# Patient Record
Sex: Male | Born: 1980 | Race: White | Hispanic: No | Marital: Married | Smoking: Never smoker
Health system: Southern US, Community
[De-identification: ages and names within clinical notes are randomized; demographics above are authoritative.]

---

## 2016-08-15 ENCOUNTER — Other Ambulatory Visit: Payer: Self-pay | Admitting: Family Medicine

## 2016-08-15 ENCOUNTER — Ambulatory Visit
Admission: RE | Admit: 2016-08-15 | Discharge: 2016-08-15 | Disposition: A | Discharge status: Home / Self Care | Payer: 59 | Source: Ambulatory Visit | Attending: Family Medicine | Admitting: Family Medicine

## 2016-08-15 DIAGNOSIS — R0781 Pleurodynia: Secondary | ICD-10-CM

## 2017-04-05 ENCOUNTER — Encounter: Payer: Self-pay | Admitting: Physician Assistant

## 2017-04-05 ENCOUNTER — Other Ambulatory Visit: Payer: Self-pay

## 2017-04-05 ENCOUNTER — Ambulatory Visit: Payer: 59 | Admitting: Physician Assistant

## 2017-04-05 VITALS — BP 126/80 | HR 84 | Temp 98.1°F | Resp 18 | Ht 71.0 in | Wt 188.6 lb

## 2017-04-05 DIAGNOSIS — J069 Acute upper respiratory infection, unspecified: Secondary | ICD-10-CM

## 2017-04-05 MED ORDER — NAPROXEN 500 MG PO TABS: 500.0000 mg | ORAL_TABLET | Freq: Two times a day (BID) | ORAL | 0 refills | Status: AC

## 2017-04-05 MED ORDER — FLUTICASONE PROPIONATE 50 MCG/ACT NA SUSP: 2.0000 | Freq: Every day | NASAL | 6 refills | Status: DC

## 2017-04-05 MED ORDER — CETIRIZINE-PSEUDOEPHEDRINE ER 5-120 MG PO TB12: 1.0000 | ORAL_TABLET | Freq: Two times a day (BID) | ORAL | 0 refills | Status: AC

## 2017-04-05 NOTE — Progress Notes (Signed)
    04/05/2017 4:20 PM   DOB: 1980-04-20 / MRN: [id number]  SUBJECTIVE:  Omar Carlson is a [age] y.o. male presenting for nasal congestion, sneezing, sore throat all of which started yesterday.  He would like a note for work.  Denies fever.  He has No Known Allergies.   He  has no past medical history on file.    He  reports that he has never smoked. He has never used smokeless tobacco. He  has no sexual activity history on file. The patient  has no past surgical history on file.  His family history is not on file.  Review of Systems  Constitutional: Negative for chills, diaphoresis and fever.  Eyes: Negative.   Respiratory: Negative for shortness of breath.   Cardiovascular: Negative for chest pain, orthopnea and leg swelling.  Gastrointestinal: Negative for abdominal pain, blood in stool, constipation, diarrhea, heartburn, melena, nausea and vomiting.  Genitourinary: Negative for flank pain.  Skin: Negative for rash.  Neurological: Negative for dizziness, sensory change, speech change, focal weakness and headaches.    The problem list and medications were reviewed and updated by myself where necessary and exist elsewhere in the encounter.   OBJECTIVE:  BP 126/80   Pulse 84   Temp 98.1 F (36.7 C) (Oral)   Resp 18   Ht 5\' 11"  (1.803 m)   Wt 188 lb 9.6 oz (85.5 kg)   SpO2 99%   BMI 26.30 kg/m   Physical Exam  Constitutional: He is oriented to person, place, and time. He is active and cooperative.  Eyes: Pupils are equal, round, and reactive to light. EOM are normal.  Cardiovascular: Normal rate, regular rhythm, S1 normal, S2 normal, normal heart sounds, intact distal pulses and normal pulses. Exam reveals no gallop and no friction rub.  No murmur heard. Pulmonary/Chest: Effort normal. No stridor. No tachypnea. No respiratory distress. He has no wheezes. He has no rales.  Abdominal: He exhibits no distension.  Musculoskeletal: He exhibits no edema.  Neurological: He is alert  and oriented to person, place, and time. He has normal strength and normal reflexes. He is not disoriented. No cranial nerve deficit or sensory deficit. He exhibits normal muscle tone. Coordination and gait normal.  Skin: Skin is warm.  Psychiatric: His behavior is normal.  Vitals reviewed.   No results found for this or any previous visit (from the past 72 hour(s)).  No results found.  ASSESSMENT AND PLAN:  [name] was seen today for nasal congestion.  Diagnoses and all orders for this visit:  Acute URI -     naproxen (NAPROSYN) 500 MG tablet; Take 1 tablet (500 mg total) by mouth 2 (two) times daily with a meal. -     cetirizine-pseudoephedrine (ZYRTEC-D) 5-120 MG tablet; Take 1 tablet by mouth 2 (two) times daily for 7 days. -     fluticasone (FLONASE) 50 MCG/ACT nasal spray; Place 2 sprays into both nostrils daily.    The patient is advised to call or return to clinic if he does not see an improvement in symptoms, or to seek the care of the closest emergency department if he worsens with the above plan.   Deliah BostonMichael Clark, MHS, PA-C [facility] 04/05/2017 4:20 PM

## 2017-04-05 NOTE — Patient Instructions (Signed)
     IF you received an x-ray today, you will receive an invoice from [facility]. Please contact [facility] at [phone] with questions or concerns regarding your invoice.   IF you received labwork today, you will receive an invoice from [facility]. Please contact [facility] at [phone] with questions or concerns regarding your invoice.   Our billing staff will not be able to assist you with questions regarding bills from these companies.  You will be contacted with the lab results as soon as they are available. The fastest way to get your results is to activate your My Chart account. Instructions are located on the last page of this paperwork. If you have not heard from us regarding the results in 2 weeks, please contact this office.     

## 2018-02-26 ENCOUNTER — Ambulatory Visit: Payer: Self-pay | Admitting: Family Medicine

## 2018-08-28 IMAGING — CR DG CHEST 2V
2 series · 2 of 2 positions shown · non-contrast
Comparison: None.

CLINICAL DATA: Fall 2 weeks ago with left-sided chest pain, initial
encounter

EXAM:
CHEST  2 VIEW

[w chest pa]
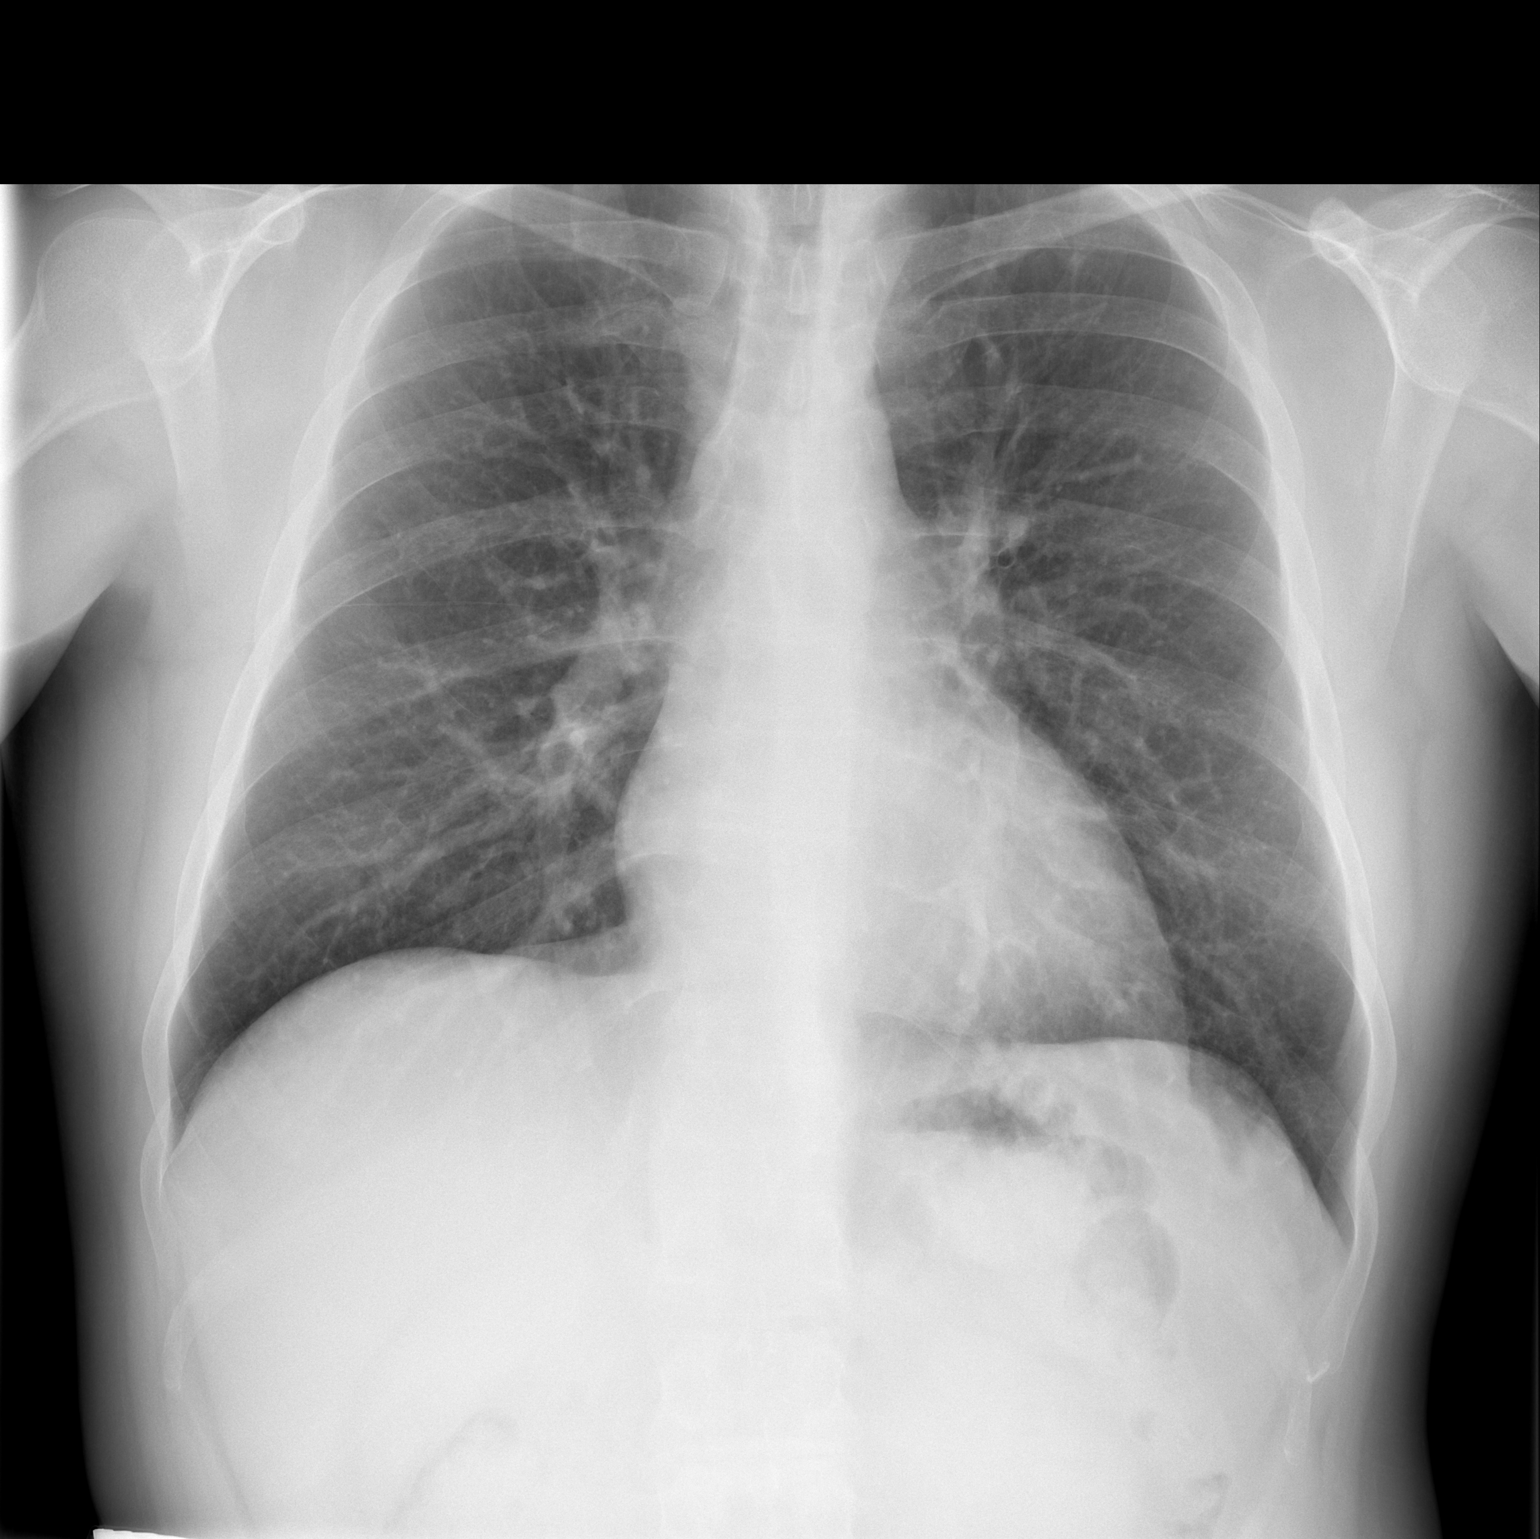

[w chest lat]
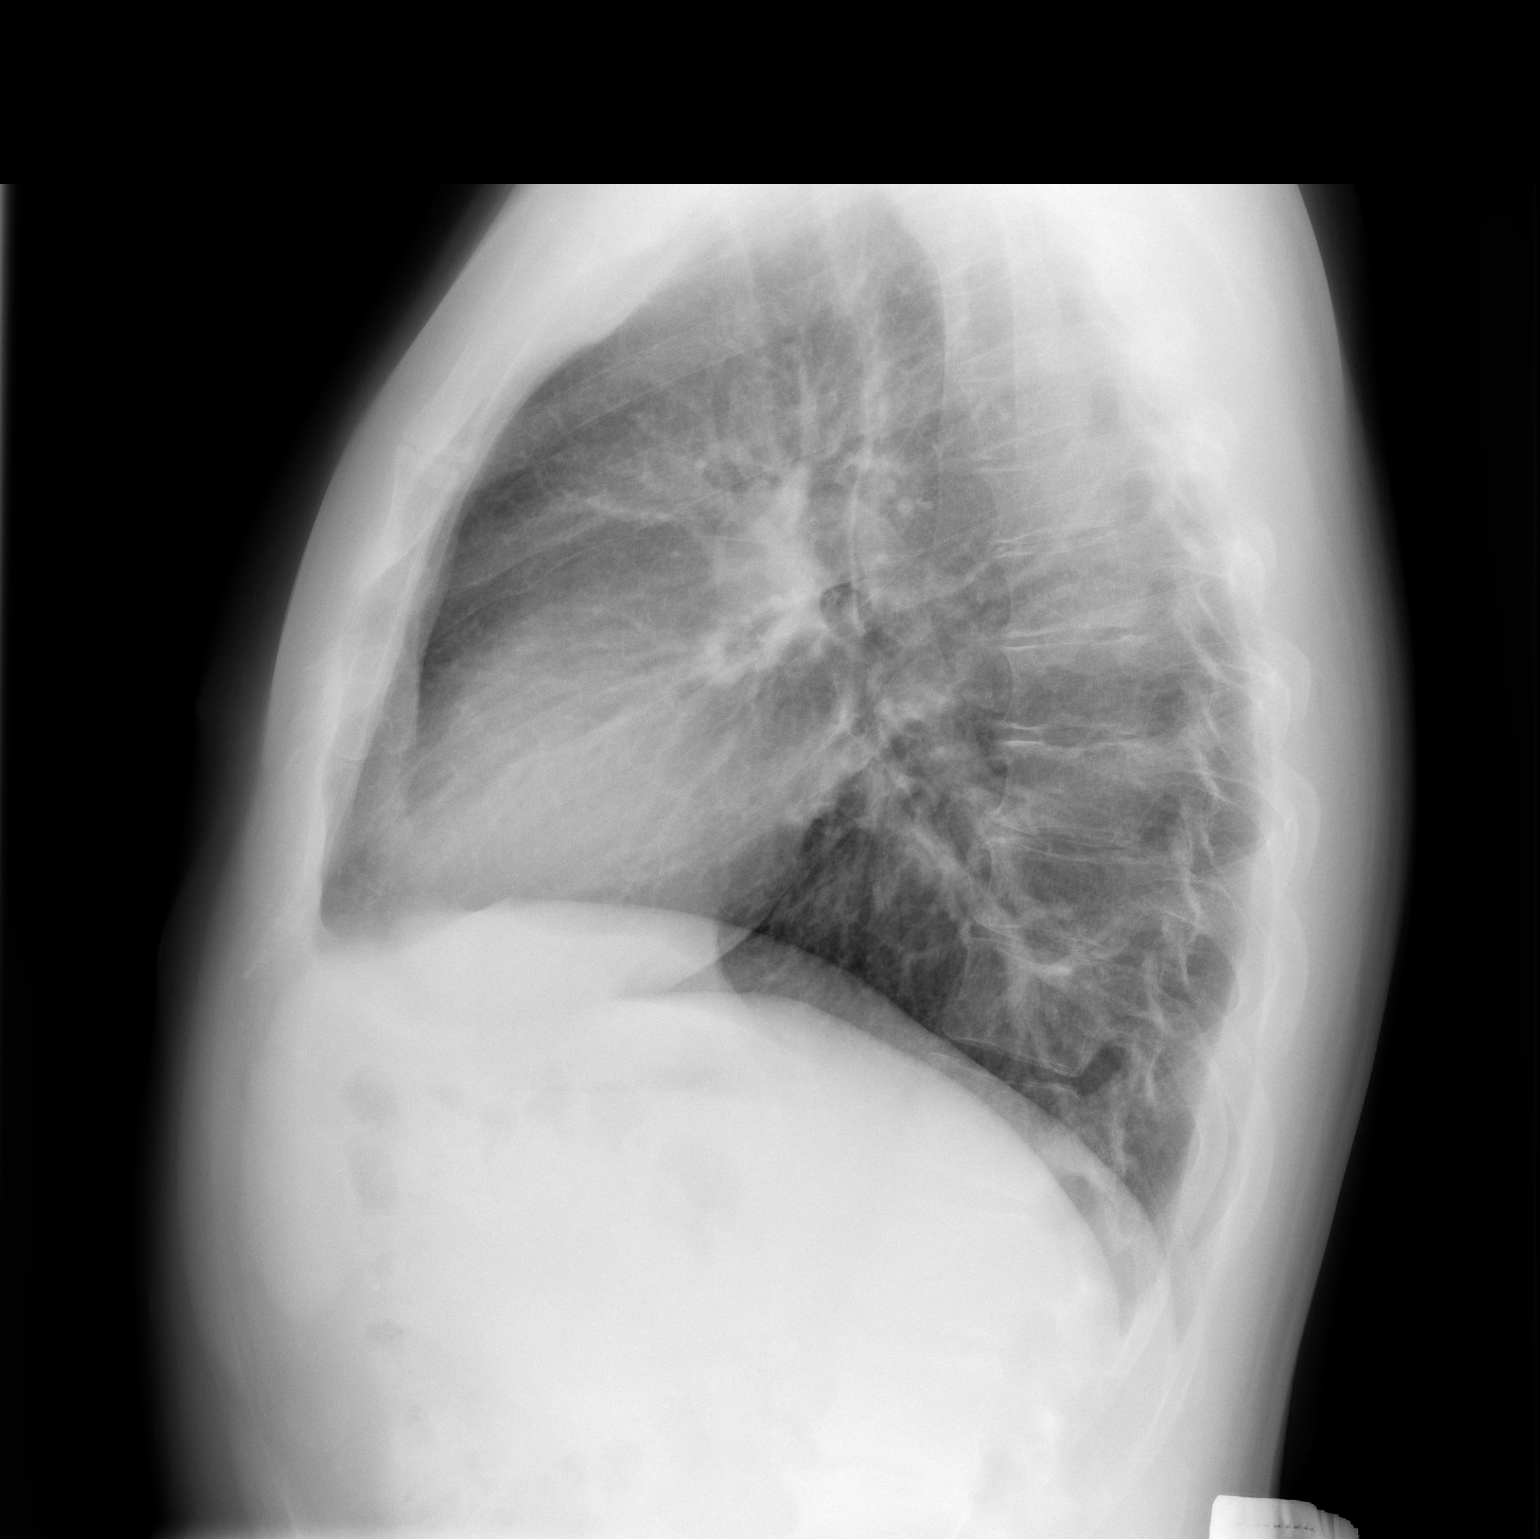

[2 of 2 positions shown; findings below may reference images not displayed]

FINDINGS: The heart size and mediastinal contours are within normal limits.
Both lungs are clear. The visualized skeletal structures are
unremarkable.
IMPRESSION: No active cardiopulmonary disease.

## 2019-02-19 ENCOUNTER — Other Ambulatory Visit: Payer: Self-pay

## 2019-02-19 ENCOUNTER — Emergency Department (HOSPITAL_COMMUNITY)
Admission: EM | Admit: 2019-02-19 | Discharge: 2019-02-19 | Disposition: A | Discharge status: Home / Self Care | Payer: 59 | Attending: Emergency Medicine | Admitting: Emergency Medicine

## 2019-02-19 ENCOUNTER — Encounter (HOSPITAL_COMMUNITY): Payer: Self-pay | Admitting: Emergency Medicine

## 2019-02-19 ENCOUNTER — Emergency Department (HOSPITAL_COMMUNITY): Payer: 59

## 2019-02-19 DIAGNOSIS — Z20822 Contact with and (suspected) exposure to covid-19: Secondary | ICD-10-CM | POA: Insufficient documentation

## 2019-02-19 DIAGNOSIS — J069 Acute upper respiratory infection, unspecified: Secondary | ICD-10-CM | POA: Insufficient documentation

## 2019-02-19 DIAGNOSIS — R05 Cough: Secondary | ICD-10-CM | POA: Diagnosis present

## 2019-02-19 DIAGNOSIS — M7918 Myalgia, other site: Secondary | ICD-10-CM | POA: Diagnosis not present

## 2019-02-19 DIAGNOSIS — R110 Nausea: Secondary | ICD-10-CM | POA: Insufficient documentation

## 2019-02-19 DIAGNOSIS — R059 Cough, unspecified: Secondary | ICD-10-CM

## 2019-02-19 LAB — CBC WITH DIFFERENTIAL/PLATELET
Abs Immature Granulocytes: 0.08 10*3/uL — ABNORMAL HIGH (ref 0.00–0.07)
Basophils Absolute: 0 10*3/uL (ref 0.0–0.1)
Basophils Relative: 0 %
Eosinophils Absolute: 0 10*3/uL (ref 0.0–0.5)
Eosinophils Relative: 0 %
HCT: 48.7 % (ref 39.0–52.0)
Hemoglobin: 16.6 g/dL (ref 13.0–17.0)
Immature Granulocytes: 1 %
Lymphocytes Relative: 5 %
Lymphs Abs: 0.8 10*3/uL (ref 0.7–4.0)
MCH: 30.2 pg (ref 26.0–34.0)
MCHC: 34.1 g/dL (ref 30.0–36.0)
MCV: 88.5 fL (ref 80.0–100.0)
Monocytes Absolute: 0.6 10*3/uL (ref 0.1–1.0)
Monocytes Relative: 4 %
Neutro Abs: 14.5 10*3/uL — ABNORMAL HIGH (ref 1.7–7.7)
Neutrophils Relative %: 90 %
Platelets: 323 10*3/uL (ref 150–400)
RBC: 5.5 MIL/uL (ref 4.22–5.81)
RDW: 12.6 % (ref 11.5–15.5)
WBC: 16.1 10*3/uL — ABNORMAL HIGH (ref 4.0–10.5)
nRBC: 0 % (ref 0.0–0.2)

## 2019-02-19 LAB — RESPIRATORY PANEL BY RT PCR (FLU A&B, COVID)
Influenza A by PCR: NEGATIVE
Influenza B by PCR: NEGATIVE
SARS Coronavirus 2 by RT PCR: NEGATIVE

## 2019-02-19 LAB — BASIC METABOLIC PANEL
Anion gap: 15 (ref 5–15)
BUN: 7 mg/dL (ref 6–20)
CO2: 25 mmol/L (ref 22–32)
Calcium: 9.8 mg/dL (ref 8.9–10.3)
Chloride: 95 mmol/L — ABNORMAL LOW (ref 98–111)
Creatinine, Ser: 0.8 mg/dL (ref 0.61–1.24)
GFR calc Af Amer: >60 mL/min (ref >60)
GFR calc non Af Amer: >60 mL/min (ref >60)
Glucose, Bld: 127 mg/dL — ABNORMAL HIGH (ref 70–99)
Potassium: 3.6 mmol/L (ref 3.5–5.1)
Sodium: 135 mmol/L (ref 135–145)

## 2019-02-19 LAB — D-DIMER, QUANTITATIVE: D-Dimer, Quant: <0.27 ug/mL-FEU (ref 0.00–0.50)

## 2019-02-19 MED ORDER — ONDANSETRON HCL 4 MG/2ML IJ SOLN
4.0000 mg | Freq: Once | INTRAMUSCULAR | Status: AC
  Administered 2019-02-19: 4 mg via INTRAVENOUS
  Filled 2019-02-19: qty 2

## 2019-02-19 MED ORDER — PROMETHAZINE HCL 25 MG/ML IJ SOLN
25.0000 mg | Freq: Once | INTRAMUSCULAR | Status: AC
  Administered 2019-02-19: 25 mg via INTRAVENOUS
  Filled 2019-02-19: qty 1

## 2019-02-19 MED ORDER — ACETAMINOPHEN-CODEINE 120-12 MG/5ML PO SOLN: 10.0000 mL | ORAL | 0 refills | Status: AC | PRN

## 2019-02-19 MED ORDER — PROMETHAZINE HCL 25 MG PO TABS: 25.0000 mg | ORAL_TABLET | Freq: Three times a day (TID) | ORAL | 0 refills | Status: DC | PRN

## 2019-02-19 MED ORDER — GUAIFENESIN ER 1200 MG PO TB12: 1.0000 | ORAL_TABLET | Freq: Two times a day (BID) | ORAL | 0 refills | Status: DC

## 2019-02-19 MED ORDER — ACETAMINOPHEN 500 MG PO TABS
1000.0000 mg | ORAL_TABLET | Freq: Once | ORAL | Status: AC
  Administered 2019-02-19: 1000 mg via ORAL
  Filled 2019-02-19: qty 2

## 2019-02-19 MED ORDER — SODIUM CHLORIDE 0.9 % IV BOLUS
1000.0000 mL | Freq: Once | INTRAVENOUS | Status: AC
  Administered 2019-02-19: 10:00:00 1000 mL via INTRAVENOUS

## 2019-02-19 NOTE — Discharge Instructions (Addendum)
Return here as needed. You still can have Covid but it could be early on and not detected. You need to quarantine for the next 10-14 days. You need to return for any worsening in your condition.

## 2019-02-19 NOTE — ED Triage Notes (Signed)
Pt arrives POV with complaints of fever 100.8 at home, body aches, chills, vomiting X3. Pt took home meds with no relief.  Pt endorses drinking 6-8 beers daily after work but says this does not feel like withdrawal.  Last drink Sunday at noon.

## 2019-02-21 ENCOUNTER — Other Ambulatory Visit: Payer: Self-pay

## 2019-02-21 ENCOUNTER — Ambulatory Visit
Admission: EM | Admit: 2019-02-21 | Discharge: 2019-02-21 | Disposition: A | Discharge status: Home / Self Care | Payer: 59 | Attending: Family Medicine | Admitting: Family Medicine

## 2019-02-21 ENCOUNTER — Encounter: Payer: Self-pay | Admitting: Family Medicine

## 2019-02-21 DIAGNOSIS — U071 COVID-19: Secondary | ICD-10-CM | POA: Diagnosis not present

## 2019-02-21 MED ORDER — LORAZEPAM 1 MG PO TABS: 1.0000 mg | ORAL_TABLET | Freq: Every evening | ORAL | 0 refills | Status: AC | PRN

## 2019-02-21 MED ORDER — PROMETHAZINE HCL 25 MG PO TABS: 25.0000 mg | ORAL_TABLET | Freq: Three times a day (TID) | ORAL | 0 refills | Status: AC | PRN

## 2019-02-21 MED ORDER — ONDANSETRON 8 MG PO TBDP: 8.0000 mg | ORAL_TABLET | Freq: Three times a day (TID) | ORAL | 0 refills | Status: AC | PRN

## 2019-02-21 NOTE — ED Triage Notes (Signed)
Pt c/o fever, cough, headache, and nausea since Sunday. States seen and tx in ED on Tuesday. Pt states not feeling any better. States had 2 neg. COVID.

## 2019-02-21 NOTE — ED Provider Notes (Addendum)
[facility]    CSN: [id number] Arrival date & time: 02/21/19  1249      History   Chief Complaint Chief Complaint  Patient presents with  . Fever    HPI Tayven Renteria is a 39 y.o. male.   Initial [facility] visit  39 yo man with shortness of breath, seen two days ago in ED with normal portable CXR, Covid test negative.  His WBC was 16,000 and d-dimer was negative.  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 States seen and tx in ED on Tuesday. Pt states not feeling any better. States had 2 neg. COVID.  History reviewed. No pertinent past medical history.  There are no problems to display for this patient.   History reviewed. No pertinent surgical history.     Home Medications    Prior to Admission medications   Medication Sig Start Date End Date Taking? Authorizing Provider  acetaminophen-codeine 120-12 MG/5ML solution Take 10 mLs by mouth every 4 (four) hours as needed for moderate pain. 02/19/19   Lawyer, Cristal Deer, PA-C  LORazepam (ATIVAN) 1 MG tablet Take 1 tablet (1 mg total) by mouth at bedtime as needed and may repeat dose one time if needed for anxiety. 02/21/19   Elvina Sidle, MD  naproxen (NAPROSYN) 500 MG tablet Take 1 tablet (500 mg total) by mouth 2 (two) times daily with a meal. 04/05/17   Ofilia Neas, PA-C  promethazine (PHENERGAN) 25 MG tablet Take 1 tablet (25 mg total) by mouth every 8 (eight) hours as needed for nausea or vomiting. 02/21/19   Elvina Sidle, MD    Family History History reviewed. No pertinent family history.  Social History Social History   Tobacco Use  . Smoking status: Never Smoker  . Smokeless tobacco: Never Used  Substance Use Topics  . Alcohol use: Yes  . Drug use: Not Currently     Allergies   Patient has no known allergies.   Review of  Systems Review of Systems  Constitutional: Positive for chills.  HENT: Negative.   Respiratory: Positive for shortness of breath.   Gastrointestinal: Positive for diarrhea and nausea.  Musculoskeletal: Positive for myalgias.  All other systems reviewed and are negative.    Physical Exam Triage Vital Signs ED Triage Vitals  Enc Vitals Group     BP      Pulse      Resp      Temp      Temp src      SpO2      Weight      Height      Head Circumference      Peak Flow      Pain Score      Pain Loc      Pain Edu?      Excl. in GC?    No data found.  Updated Vital Signs BP (!) 165/99 (BP Location: Left Arm)   Pulse (!) 126   Temp 100.1 F (37.8 C) (Oral)   Resp 18   SpO2 94%    Physical Exam Vitals and nursing note reviewed.  Constitutional:      General: He is not in acute distress.    Appearance: Normal appearance. He is normal weight.  HENT:     Head: Normocephalic.  Eyes:     Conjunctiva/sclera:  Conjunctivae normal.  Cardiovascular:     Rate and Rhythm: Tachycardia present.     Pulses: Normal pulses.     Heart sounds: Normal heart sounds.  Pulmonary:     Breath sounds: Normal breath sounds.     Comments: Increased effort of breathing Musculoskeletal:        General: Normal range of motion.     Cervical back: Normal range of motion and neck supple.  Skin:    General: Skin is warm and dry.  Neurological:     General: No focal deficit present.     Mental Status: He is alert.  Psychiatric:        Mood and Affect: Mood normal.        Behavior: Behavior normal.      UC Treatments / Results  Labs (all labs ordered are listed, but only abnormal results are displayed) Labs Reviewed - No data to display  EKG   Radiology No results found.  Procedures Procedures (including critical care time)  Medications Ordered in UC Medications - No data to display  Initial Impression / Assessment and Plan / UC Course  I have reviewed the triage vital signs  and the nursing notes.  Pertinent labs & imaging results that were available during my care of the patient were reviewed by me and considered in my medical decision making (see chart for details).    Final Clinical Impressions(s) / UC Diagnoses   Final diagnoses:  Clinical diagnosis of COVID-19     Discharge Instructions     If your breathing becomes more difficult, go to the emergency room  You have the symptoms of Covid-19 and are diagnosed with it today.  Remain out of work until next Wednesday unless you are still running a fever.    ED Prescriptions    Medication Sig Dispense Auth. Provider   LORazepam (ATIVAN) 1 MG tablet Take 1 tablet (1 mg total) by mouth at bedtime as needed and may repeat dose one time if needed for anxiety. 15 tablet Robyn Haber, MD   promethazine (PHENERGAN) 25 MG tablet Take 1 tablet (25 mg total) by mouth every 8 (eight) hours as needed for nausea or vomiting. 30 tablet Robyn Haber, MD     PDMP not reviewed this encounter.   Robyn Haber, MD 02/21/19 1326    Robyn Haber, MD 02/21/19 1333    Robyn Haber, MD 02/21/19 1334

## 2019-02-21 NOTE — Discharge Instructions (Addendum)
If your breathing becomes more difficult, go to the emergency room  You have the symptoms of Covid-19 and are diagnosed with it today.  Remain out of work until next Wednesday unless you are still running a fever.

## 2019-02-23 NOTE — ED Provider Notes (Signed)
[facility] EMERGENCY DEPARTMENT Provider Note   CSN: [id number] Arrival date & time: 02/19/19  0300     History No chief complaint on file.   Omar Carlson is a 39 y.o. male.  HPI Patient presents to the emergency department with body aches, nausea, cough, and general malaise.  The patient states the symptoms started 2 days ago.  Patient states that he normally drinks 8 beers a day but has not had any since Sunday.  The patient states that he did not take any medications prior to arrival.  The patient states that he did not take any medications prior to arrival.  The patient denies chest pain, shortness of breath, headache,blurred vision, neck pain, fever,weakness, numbness, dizziness, anorexia, edema, abdominal pain,vomiting, diarrhea, rash, back pain, dysuria, hematemesis, bloody stool, near syncope, or syncope.  History reviewed. No pertinent past medical history.  There are no problems to display for this patient.   History reviewed. No pertinent surgical history.     No family history on file.  Social History   Tobacco Use  . Smoking status: Never Smoker  . Smokeless tobacco: Never Used  Substance Use Topics  . Alcohol use: Yes  . Drug use: Not Currently    Home Medications Prior to Admission medications   Medication Sig Start Date End Date Taking? Authorizing Provider  acetaminophen-codeine 120-12 MG/5ML solution Take 10 mLs by mouth every 4 (four) hours as needed for moderate pain. 02/19/19   Ottie Neglia, Cristal Deer, PA-C  LORazepam (ATIVAN) 1 MG tablet Take 1 tablet (1 mg total) by mouth at bedtime as needed and may repeat dose one time if needed for anxiety. 02/21/19   Elvina Sidle, MD  naproxen (NAPROSYN) 500 MG tablet Take 1 tablet (500 mg total) by mouth 2 (two) times daily with a meal. 04/05/17   Ofilia Neas, PA-C  ondansetron (ZOFRAN-ODT) 8 MG disintegrating tablet Take 1 tablet (8 mg total) by mouth every 8 (eight) hours as needed for nausea.  02/21/19   Elvina Sidle, MD  promethazine (PHENERGAN) 25 MG tablet Take 1 tablet (25 mg total) by mouth every 8 (eight) hours as needed for nausea or vomiting. 02/21/19   Elvina Sidle, MD    Allergies    Patient has no known allergies.  Review of Systems   Review of Systems All other systems negative except as documented in the HPI. All pertinent positives and negatives as reviewed in the HPI. Physical Exam Updated Vital Signs BP (!) 160/99 (BP Location: Right Arm)   Pulse 100   Temp 98.6 F (37 C) (Oral)   Resp 16   Ht 5\' 11"  (1.803 m)   Wt 88.5 kg   SpO2 96%   BMI 27.20 kg/m   Physical Exam Vitals and nursing note reviewed.  Constitutional:      General: He is not in acute distress.    Appearance: He is well-developed.  HENT:     Head: Normocephalic and atraumatic.  Eyes:     Pupils: Pupils are equal, round, and reactive to light.  Cardiovascular:     Rate and Rhythm: Normal rate and regular rhythm.     Heart sounds: Normal heart sounds. No murmur. No friction rub. No gallop.   Pulmonary:     Effort: Pulmonary effort is normal. No respiratory distress.     Breath sounds: Normal breath sounds. No wheezing.  Abdominal:     General: Bowel sounds are normal. There is no distension.     Palpations: Abdomen  is soft.     Tenderness: There is no abdominal tenderness.  Musculoskeletal:     Cervical back: Normal range of motion and neck supple.  Skin:    General: Skin is warm and dry.     Capillary Refill: Capillary refill takes less than 2 seconds.     Findings: No erythema or rash.  Neurological:     Mental Status: He is alert and oriented to person, place, and time.     Motor: No abnormal muscle tone.     Coordination: Coordination normal.  Psychiatric:        Behavior: Behavior normal.     ED Results / Procedures / Treatments   Labs (all labs ordered are listed, but only abnormal results are displayed) Labs Reviewed  CBC WITH DIFFERENTIAL/PLATELET -  Abnormal; Notable for the following components:      Result Value   WBC 16.1 (*)    Neutro Abs 14.5 (*)    Abs Immature Granulocytes 0.08 (*)    All other components within normal limits  BASIC METABOLIC PANEL - Abnormal; Notable for the following components:   Chloride 95 (*)    Glucose, Bld 127 (*)    All other components within normal limits  RESPIRATORY PANEL BY RT PCR (FLU A&B, COVID)  D-DIMER, QUANTITATIVE (NOT AT [facility])    EKG None  Radiology No results found.  Procedures Procedures (including critical care time)  Medications Ordered in ED Medications  sodium chloride 0.9 % bolus 1,000 mL (0 mLs Intravenous Stopped 02/19/19 1259)  promethazine (PHENERGAN) injection 25 mg (25 mg Intravenous Given 02/19/19 1005)  ondansetron (ZOFRAN) injection 4 mg (4 mg Intravenous Given 02/19/19 1451)  acetaminophen (TYLENOL) tablet 1,000 mg (1,000 mg Oral Given 02/19/19 1451)    ED Course  I have reviewed the triage vital signs and the nursing notes.  Pertinent labs & imaging results that were available during my care of the patient were reviewed by me and considered in my medical decision making (see chart for details).    MDM Rules/Calculators/A&P                      Patient most likely has Covid even if his test is negative today.  I did give him a liter of fluid and advised him to increase his fluid intake patient sent home with symptomatic control.  Advised the patient to return here as needed.  To return for any worsening in his condition. Final Clinical Impression(s) / ED Diagnoses Final diagnoses:  Viral URI with cough    Rx / DC Orders ED Discharge Orders         Ordered    Guaifenesin 1200 MG TB12  2 times daily,   Status:  Discontinued     02/19/19 1442    acetaminophen-codeine 120-12 MG/5ML solution  Every 4 hours PRN     02/19/19 1442    promethazine (PHENERGAN) 25 MG tablet  Every 8 hours PRN,   Status:  Discontinued     02/19/19 [place],  Siddhanth Denk, PA-C 02/23/19 [place], DO 02/24/19 1501

## 2019-03-04 ENCOUNTER — Telehealth: Payer: Self-pay | Admitting: Emergency Medicine

## 2019-03-04 ENCOUNTER — Telehealth (HOSPITAL_COMMUNITY): Payer: Self-pay

## 2019-03-04 NOTE — Telephone Encounter (Signed)
Pt called stating he needs an extension to his work note for absence for COVID.  Patient states he was still having fever on the date marked for his return, and was not able to return for three more days after.  States he was "too sick" to call at time of need for extension to discuss with medical staff.  Explained to patient I will need approval from a provider to extend at this time and will return call when provider is able to review after seeing patients in clinic.  [address] Location number given, patient hoping to speak directly to Dr. Elbert Ewings.

## 2019-03-04 NOTE — Telephone Encounter (Signed)
Pt called requesting his work note to be extended from return to work on 2/17 to return to work on 2/22. This RN spoke with Dr. Leonides Grills regarding extending the patients work note. Dr. Leonides Grills reviewed the patients chart and advised that the length of time the patient was given was adequate. Pt informed and educated that if he is still feeling ill to come back in for re-evaluation. The patient verbalized understanding. Reports being upset over this information.

## 2021-03-03 IMAGING — DX DG CHEST 1V PORT
1 series · 1 of 1 positions shown · non-contrast
Comparison: August 15, 2016

CLINICAL DATA: Fever with chills.  Chest pain

EXAM:
PORTABLE CHEST 1 VIEW

[chest ap]
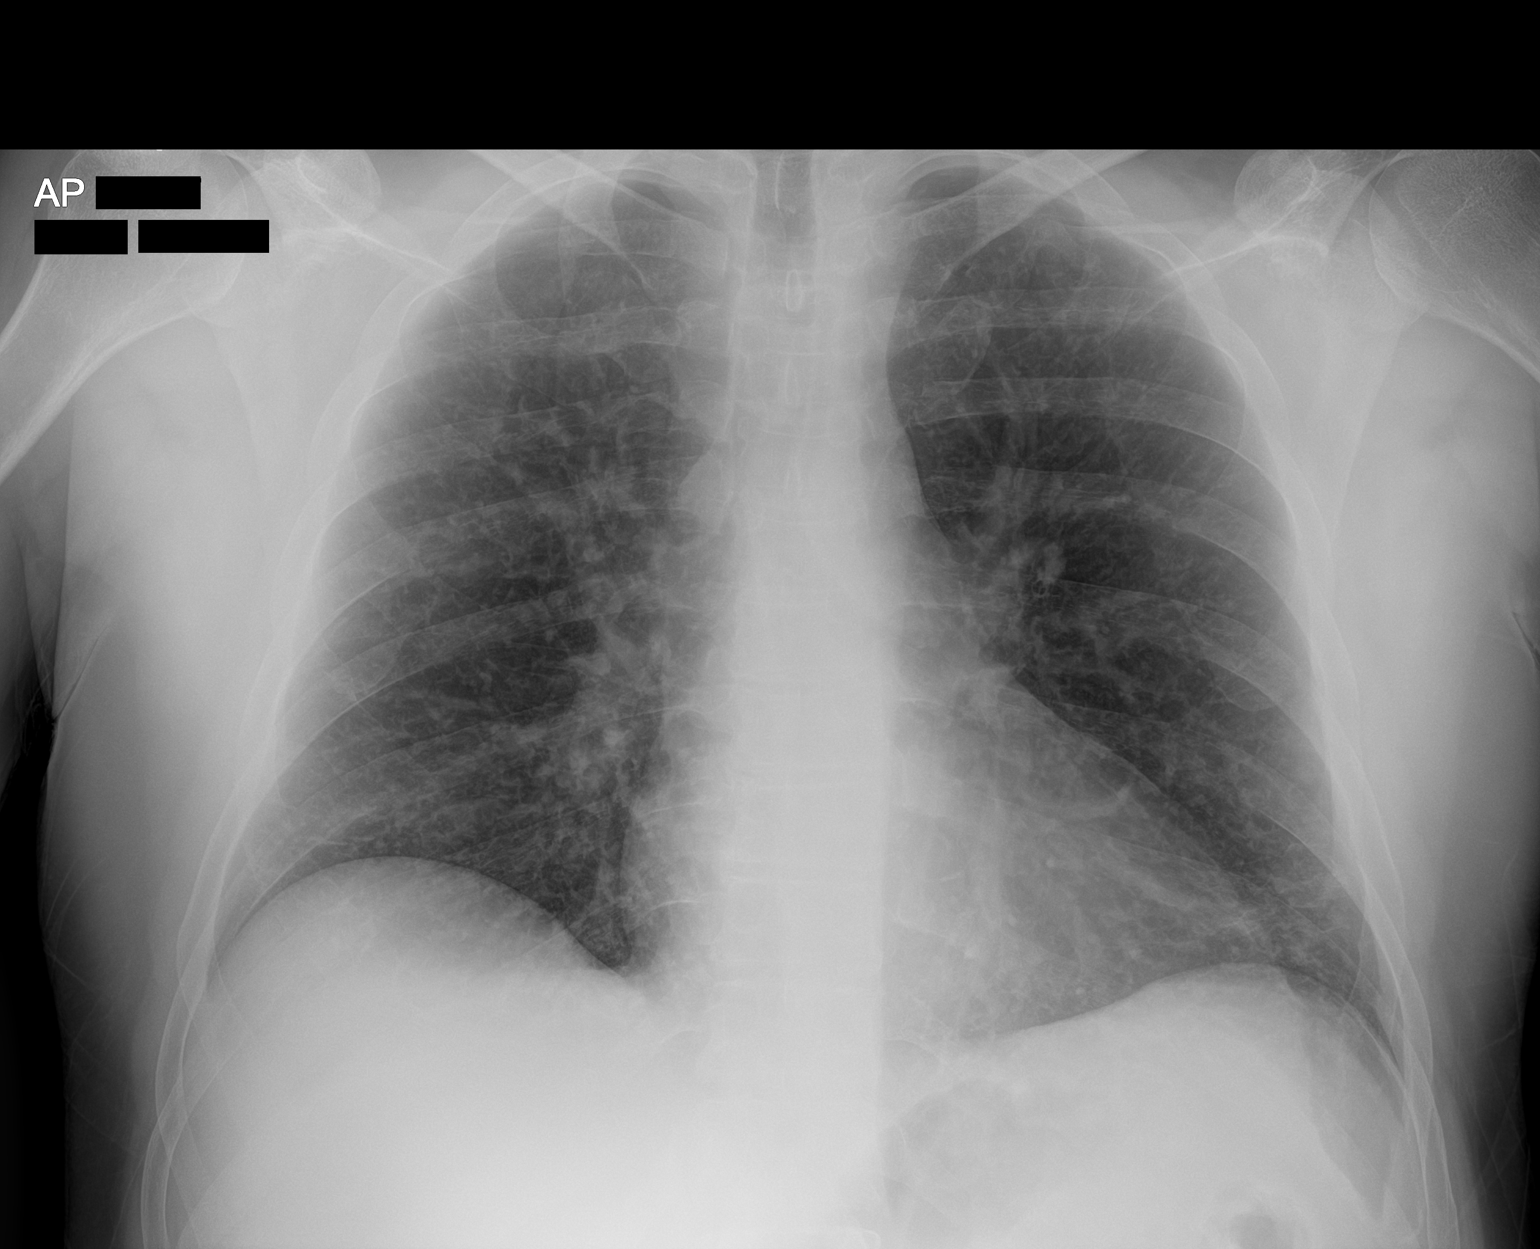

[1 of 1 positions shown; findings below may reference images not displayed]

FINDINGS: Lungs are clear. Heart size and pulmonary vascularity are normal. No
adenopathy. No pneumothorax. No bone lesions.
IMPRESSION: Lungs clear.  No adenopathy.
